# Patient Record
Sex: Male | Born: 1995 | Race: White | Hispanic: No | Marital: Single | Smoking: Current some day smoker
Health system: Southern US, Community
[De-identification: ages and names within clinical notes are randomized; demographics above are authoritative.]

## PROBLEM LIST (undated history)

## (undated) DIAGNOSIS — Z9289 Personal history of other medical treatment: Secondary | ICD-10-CM

## (undated) DIAGNOSIS — J45990 Exercise induced bronchospasm: Secondary | ICD-10-CM

## (undated) DIAGNOSIS — Y249XXA Unspecified firearm discharge, undetermined intent, initial encounter: Secondary | ICD-10-CM

## (undated) DIAGNOSIS — F329 Major depressive disorder, single episode, unspecified: Secondary | ICD-10-CM

## (undated) DIAGNOSIS — S069X9A Unspecified intracranial injury with loss of consciousness of unspecified duration, initial encounter: Secondary | ICD-10-CM

## (undated) DIAGNOSIS — F32A Depression, unspecified: Secondary | ICD-10-CM

## (undated) DIAGNOSIS — S060X9A Concussion with loss of consciousness of unspecified duration, initial encounter: Secondary | ICD-10-CM

## (undated) DIAGNOSIS — F419 Anxiety disorder, unspecified: Secondary | ICD-10-CM

## (undated) HISTORY — PX: TONSILLECTOMY AND ADENOIDECTOMY: SUR1326

## (undated) HISTORY — PX: TYMPANOSTOMY TUBE PLACEMENT: SHX32

---
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
## 2016-01-13 ENCOUNTER — Ambulatory Visit (INDEPENDENT_AMBULATORY_CARE_PROVIDER_SITE_OTHER): Payer: Managed Care, Other (non HMO) | Admitting: Urgent Care

## 2016-01-13 VITALS — BP 124/78 | HR 72 | Temp 97.7°F | Resp 12 | Ht 67.0 in | Wt 197.0 lb

## 2016-01-13 DIAGNOSIS — F17200 Nicotine dependence, unspecified, uncomplicated: Secondary | ICD-10-CM

## 2016-01-13 DIAGNOSIS — Z91048 Other nonmedicinal substance allergy status: Secondary | ICD-10-CM

## 2016-01-13 DIAGNOSIS — J4530 Mild persistent asthma, uncomplicated: Secondary | ICD-10-CM

## 2016-01-13 DIAGNOSIS — Z9109 Other allergy status, other than to drugs and biological substances: Secondary | ICD-10-CM

## 2016-01-13 DIAGNOSIS — H65192 Other acute nonsuppurative otitis media, left ear: Secondary | ICD-10-CM

## 2016-01-13 MED ORDER — MONTELUKAST SODIUM 10 MG PO TABS: 10.0000 mg | ORAL_TABLET | Freq: Every day | ORAL | Status: AC

## 2016-01-13 MED ORDER — AMOXICILLIN-POT CLAVULANATE 875-125 MG PO TABS: 1.0000 | ORAL_TABLET | Freq: Two times a day (BID) | ORAL | Status: DC

## 2016-01-13 MED ORDER — CETIRIZINE HCL 10 MG PO TABS: 10.0000 mg | ORAL_TABLET | Freq: Every day | ORAL | Status: DC

## 2016-01-13 MED ORDER — ALBUTEROL SULFATE HFA 108 (90 BASE) MCG/ACT IN AERS: 2.0000 | INHALATION_SPRAY | Freq: Four times a day (QID) | RESPIRATORY_TRACT | Status: AC | PRN

## 2016-01-13 NOTE — Progress Notes (Signed)
    MRN: [id number] DOB: 13-Dec-1995  Subjective:   Nathan Carter is a 20 y.o. male presenting for chief complaint of Otalgia  Reports 5 day history of left ear pain, fullness, decreased hearing, dizziness, slight tinnitus. Also has sinus congestion L>R, productive cough, shob, wheezing. Has not tried any medications for this. Denies fever, chest pain. Admits history of sports-induced asthma, needs another inhaler. Also has history eczema, allergies, does not take medications consistently for this. As a child patient had frequent ear infections, tympanostomy tubes requiring surgery for removal at 20 y/o. Smokes 1/2ppd for the past 4 years. Smokes marijuana multiple times a week. Has alcohol drink occasionally. Works in [employer], constantly feels that he is breathing in dust and dirty air. Has not thought about changing line of work.  Nathan Carter has a current medication list which includes the following prescription(s): albuterol, melatonin, methylphenidate, multivitamin, and zolpidem. Also has No Known Allergies.  Nathan Carter  has a past medical history of Depression and Asthma. Also  has past surgical history that includes Tympanostomy tube placement; Craniotomy (03/21/2012); and Craniotomy (08/20/2012).   Family history in parents and siblings is negative for heart disease, lung disease.  Objective:   Vitals: BP 124/78 mmHg  Pulse 72  Temp(Src) 97.7 F (36.5 C) (Oral)  Resp 12  Ht  (1.702 m)  Wt 197 lb (89.359 kg)  BMI 30.85 kg/m2  SpO2 97%  Physical Exam  Constitutional: He is oriented to person, place, and time. He appears well-developed and well-nourished.  HENT:  TM's flat bilaterally with signs of sclerosis, no effusions or erythema, tenderness over tragus, L>R. Nasal turbinates slightly erythematous with whitish mucus. No sinus tenderness. Postnasal drip present, without oropharyngeal exudates, erythema or abscesses.   Eyes: Right eye exhibits no discharge. Left eye  exhibits no discharge. No scleral icterus.  Neck: Normal range of motion. Neck supple.  Cardiovascular: Normal rate, regular rhythm and intact distal pulses.  Exam reveals no gallop and no friction rub.   No murmur heard. Pulmonary/Chest: No respiratory distress. He has no wheezes. He has no rales.  Lymphadenopathy:    He has no cervical adenopathy.  Neurological: He is alert and oriented to person, place, and time.  Skin: Skin is warm and dry.    Assessment and Plan :   1. Acute nonsuppurative otitis media of left ear - Start Augmentin to cover for infectious process, rtc in 1 week if no improvement.  2. Extrinsic asthma, mild persistent, uncomplicated - Start Singulair. Recommended patient change work since he is constantly inhaling respiratory irritants in [employer].  3. Environmental allergies - Start management with Zyrtec and Singulair.  4. Tobacco use disorder - Advised smoking cessation, patient will let me know if he needs medical therapy.  Wallis Bamberg, PA-C [facility] [phone] 01/13/2016 11:57 AM

## 2016-01-13 NOTE — Patient Instructions (Signed)
Otitis Media, Adult Otitis media is redness, soreness, and inflammation of the middle ear. Otitis media may be caused by allergies or, most commonly, by infection. Often it occurs as a complication of the common cold. SIGNS AND SYMPTOMS Symptoms of otitis media may include:  Earache.  Fever.  Ringing in your ear.  Headache.  Leakage of fluid from the ear. DIAGNOSIS To diagnose otitis media, your health care provider will examine your ear with an otoscope. This is an instrument that allows your health care provider to see into your ear in order to examine your eardrum. Your health care provider also will ask you questions about your symptoms. TREATMENT  Typically, otitis media resolves on its own within 3-5 days. Your health care provider may prescribe medicine to ease your symptoms of pain. If otitis media does not resolve within 5 days or is recurrent, your health care provider may prescribe antibiotic medicines if he or she suspects that a bacterial infection is the cause. HOME CARE INSTRUCTIONS   If you were prescribed an antibiotic medicine, finish it all even if you start to feel better.  Take medicines only as directed by your health care provider.  Keep all follow-up visits as directed by your health care provider. SEEK MEDICAL CARE IF:  You have otitis media only in one ear, or bleeding from your nose, or both.  You notice a lump on your neck.  You are not getting better in 3-5 days.  You feel worse instead of better. SEEK IMMEDIATE MEDICAL CARE IF:   You have pain that is not controlled with medicine.  You have swelling, redness, or pain around your ear or stiffness in your neck.  You notice that part of your face is paralyzed.  You notice that the bone behind your ear (mastoid) is tender when you touch it. MAKE SURE YOU:   Understand these instructions.  Will watch your condition.  Will get help right away if you are not doing well or get worse.   This  information is not intended to replace advice given to you by your health care provider. Make sure you discuss any questions you have with your health care provider.   Document Released: 08/08/2004 Document Revised: 11/24/2014 Document Reviewed: 05/31/2013 Elsevier Interactive Patient Education [employer]    Allergies An allergy is an abnormal reaction to a substance by the body's defense system (immune system). Allergies can develop at any age. WHAT CAUSES ALLERGIES? An allergic reaction happens when the immune system mistakenly reacts to a normally harmless substance, called an allergen, as if it were harmful. The immune system releases antibodies to fight the substance. Antibodies eventually release a chemical called histamine into the bloodstream. The release of histamine is meant to protect the body from infection, but it also causes discomfort. An allergic reaction can be triggered by:  Eating an allergen.  Inhaling an allergen.  Touching an allergen. WHAT TYPES OF ALLERGIES ARE THERE? There are many types of allergies. Common types include:  Seasonal allergies. People with this type of allergy are usually allergic to substances that are only present during certain seasons, such as molds and pollens.  Food allergies.  Drug allergies.  Insect allergies.  Animal dander allergies. WHAT ARE SYMPTOMS OF ALLERGIES? Possible allergy symptoms include:  Swelling of the lips, face, tongue, mouth, or throat.  Sneezing, coughing, or wheezing.  Nasal congestion.  Tingling in the mouth.  Rash.  Itching.  Itchy, red, swollen areas of skin (hives).  Watery  eyes.  Vomiting.  Diarrhea.  Dizziness.  Lightheadedness.  Fainting.  Trouble breathing or swallowing.  Chest tightness.  Rapid heartbeat. HOW ARE ALLERGIES DIAGNOSED? Allergies are diagnosed with a medical and family history and one or more of the following:  Skin tests.  Blood tests.  A food  diary. A food diary is a record of all the foods and drinks you have in a day and of all the symptoms you experience.  The results of an elimination diet. An elimination diet involves eliminating foods from your diet and then adding them back in one by one to find out if a certain food causes an allergic reaction. HOW ARE ALLERGIES TREATED? There is no cure for allergies, but allergic reactions can be treated with medicine. Severe reactions usually need to be treated at a hospital. HOW CAN REACTIONS BE PREVENTED? The best way to prevent an allergic reaction is by avoiding the substance you are allergic to. Allergy shots and medicines can also help prevent reactions in some cases. People with severe allergic reactions may be able to prevent a life-threatening reaction called anaphylaxis with a medicine given right after exposure to the allergen.   This information is not intended to replace advice given to you by your health care provider. Make sure you discuss any questions you have with your health care provider.   Document Released: 01/27/2003 Document Revised: 11/24/2014 Document Reviewed: 08/15/2014 Elsevier Interactive Patient Education 2016 [employer].     Smoking Cessation, Tips for Success If you are ready to quit smoking, congratulations! You have chosen to help yourself be healthier. Cigarettes bring nicotine, tar, carbon monoxide, and other irritants into your body. Your lungs, heart, and blood vessels will be able to work better without these poisons. There are many different ways to quit smoking. Nicotine gum, nicotine patches, a nicotine inhaler, or nicotine nasal spray can help with physical craving. Hypnosis, support groups, and medicines help break the habit of smoking. WHAT THINGS CAN I DO TO MAKE QUITTING EASIER?  Here are some tips to help you quit for good:  Pick a date when you will quit smoking completely. Tell all of your friends and family about your plan to quit on  that date.  Do not try to slowly cut down on the number of cigarettes you are smoking. Pick a quit date and quit smoking completely starting on that day.  Throw away all cigarettes.   Clean and remove all ashtrays from your home, work, and car.  On a card, write down your reasons for quitting. Carry the card with you and read it when you get the urge to smoke.  Cleanse your body of nicotine. Drink enough water and fluids to keep your urine clear or pale yellow. Do this after quitting to flush the nicotine from your body.  Learn to predict your moods. Do not let a bad situation be your excuse to have a cigarette. Some situations in your life might tempt you into wanting a cigarette.  Never have "just one" cigarette. It leads to wanting another and another. Remind yourself of your decision to quit.  Change habits associated with smoking. If you smoked while driving or when feeling stressed, try other activities to replace smoking. Stand up when drinking your coffee. Brush your teeth after eating. Sit in a different chair when you read the paper. Avoid alcohol while trying to quit, and try to drink fewer caffeinated beverages. Alcohol and caffeine may urge you to smoke.  Avoid foods  and drinks that can trigger a desire to smoke, such as sugary or spicy foods and alcohol.  Ask people who smoke not to smoke around you.  Have something planned to do right after eating or having a cup of coffee. For example, plan to take a walk or exercise.  Try a relaxation exercise to calm you down and decrease your stress. Remember, you may be tense and nervous for the first 2 weeks after you quit, but this will pass.  Find new activities to keep your hands busy. Play with a pen, coin, or rubber band. Doodle or draw things on paper.  Brush your teeth right after eating. This will help cut down on the craving for the taste of tobacco after meals. You can also try mouthwash.   Use oral substitutes in place  of cigarettes. Try using lemon drops, carrots, cinnamon sticks, or chewing gum. Keep them handy so they are available when you have the urge to smoke.  When you have the urge to smoke, try deep breathing.  Designate your home as a nonsmoking area.  If you are a heavy smoker, ask your health care provider about a prescription for nicotine chewing gum. It can ease your withdrawal from nicotine.  Reward yourself. Set aside the cigarette money you save and buy yourself something nice.  Look for support from others. Join a support group or smoking cessation program. Ask someone at home or at work to help you with your plan to quit smoking.  Always ask yourself, "Do I need this cigarette or is this just a reflex?" Tell yourself, "Today, I choose not to smoke," or "I do not want to smoke." You are reminding yourself of your decision to quit.  Do not replace cigarette smoking with electronic cigarettes (commonly called e-cigarettes). The safety of e-cigarettes is unknown, and some may contain harmful chemicals.  If you relapse, do not give up! Plan ahead and think about what you will do the next time you get the urge to smoke. HOW WILL I FEEL WHEN I QUIT SMOKING? You may have symptoms of withdrawal because your body is used to nicotine (the addictive substance in cigarettes). You may crave cigarettes, be irritable, feel very hungry, cough often, get headaches, or have difficulty concentrating. The withdrawal symptoms are only temporary. They are strongest when you first quit but will go away within 10-14 days. When withdrawal symptoms occur, stay in control. Think about your reasons for quitting. Remind yourself that these are signs that your body is healing and getting used to being without cigarettes. Remember that withdrawal symptoms are easier to treat than the major diseases that smoking can cause.  Even after the withdrawal is over, expect periodic urges to smoke. However, these cravings are  generally short lived and will go away whether you smoke or not. Do not smoke! WHAT RESOURCES ARE AVAILABLE TO HELP ME QUIT SMOKING? Your health care provider can direct you to community resources or hospitals for support, which may include:  Group support.  Education.  Hypnosis.  Therapy.   This information is not intended to replace advice given to you by your health care provider. Make sure you discuss any questions you have with your health care provider.   Document Released: 08/01/2004 Document Revised: 11/24/2014 Document Reviewed: 04/21/2013 Elsevier Interactive Patient Education [employer]

## 2016-05-22 ENCOUNTER — Encounter (HOSPITAL_COMMUNITY): Payer: Self-pay | Admitting: Emergency Medicine

## 2016-05-22 ENCOUNTER — Emergency Department (HOSPITAL_COMMUNITY): Payer: No Typology Code available for payment source

## 2016-05-22 ENCOUNTER — Inpatient Hospital Stay (HOSPITAL_COMMUNITY)
Admission: EM | Admit: 2016-05-22 | Discharge: 2016-05-25 | DRG: 964 | Disposition: A | Discharge status: Home Health | Payer: No Typology Code available for payment source | Attending: General Surgery | Admitting: General Surgery

## 2016-05-22 DIAGNOSIS — M25552 Pain in left hip: Secondary | ICD-10-CM | POA: Diagnosis present

## 2016-05-22 DIAGNOSIS — S32592A Other specified fracture of left pubis, initial encounter for closed fracture: Secondary | ICD-10-CM | POA: Diagnosis present

## 2016-05-22 DIAGNOSIS — R58 Hemorrhage, not elsewhere classified: Secondary | ICD-10-CM | POA: Diagnosis present

## 2016-05-22 DIAGNOSIS — S36899A Unspecified injury of other intra-abdominal organs, initial encounter: Secondary | ICD-10-CM | POA: Diagnosis present

## 2016-05-22 DIAGNOSIS — F329 Major depressive disorder, single episode, unspecified: Secondary | ICD-10-CM | POA: Diagnosis present

## 2016-05-22 DIAGNOSIS — S3210XA Unspecified fracture of sacrum, initial encounter for closed fracture: Secondary | ICD-10-CM | POA: Diagnosis present

## 2016-05-22 DIAGNOSIS — Z79899 Other long term (current) drug therapy: Secondary | ICD-10-CM

## 2016-05-22 DIAGNOSIS — F419 Anxiety disorder, unspecified: Secondary | ICD-10-CM | POA: Diagnosis present

## 2016-05-22 DIAGNOSIS — M542 Cervicalgia: Secondary | ICD-10-CM

## 2016-05-22 DIAGNOSIS — Z87820 Personal history of traumatic brain injury: Secondary | ICD-10-CM | POA: Diagnosis not present

## 2016-05-22 DIAGNOSIS — S161XXA Strain of muscle, fascia and tendon at neck level, initial encounter: Secondary | ICD-10-CM | POA: Diagnosis present

## 2016-05-22 DIAGNOSIS — J45909 Unspecified asthma, uncomplicated: Secondary | ICD-10-CM | POA: Diagnosis present

## 2016-05-22 DIAGNOSIS — F17210 Nicotine dependence, cigarettes, uncomplicated: Secondary | ICD-10-CM | POA: Diagnosis present

## 2016-05-22 DIAGNOSIS — S060X9A Concussion with loss of consciousness of unspecified duration, initial encounter: Secondary | ICD-10-CM | POA: Diagnosis present

## 2016-05-22 DIAGNOSIS — S32512A Fracture of superior rim of left pubis, initial encounter for closed fracture: Secondary | ICD-10-CM | POA: Diagnosis present

## 2016-05-22 DIAGNOSIS — S060XAA Concussion with loss of consciousness status unknown, initial encounter: Secondary | ICD-10-CM

## 2016-05-22 DIAGNOSIS — S329XXA Fracture of unspecified parts of lumbosacral spine and pelvis, initial encounter for closed fracture: Secondary | ICD-10-CM

## 2016-05-22 HISTORY — DX: Unspecified firearm discharge, undetermined intent, initial encounter: Y24.9XXA

## 2016-05-22 HISTORY — DX: Concussion with loss of consciousness of unspecified duration, initial encounter: S06.0X9A

## 2016-05-22 HISTORY — DX: Unspecified intracranial injury with loss of consciousness of unspecified duration, initial encounter: S06.9X9A

## 2016-05-22 HISTORY — DX: Personal history of other medical treatment: Z92.89

## 2016-05-22 HISTORY — DX: Exercise induced bronchospasm: J45.990

## 2016-05-22 HISTORY — DX: Concussion with loss of consciousness status unknown, initial encounter: S06.0XAA

## 2016-05-22 HISTORY — DX: Anxiety disorder, unspecified: F41.9

## 2016-05-22 LAB — COMPREHENSIVE METABOLIC PANEL
ALK PHOS: 58 U/L (ref 38–126)
ALT: 82 U/L — AB (ref 17–63)
ANION GAP: 13 (ref 5–15)
AST: 58 U/L — ABNORMAL HIGH (ref 15–41)
Albumin: 4.2 g/dL (ref 3.5–5.0)
BILIRUBIN TOTAL: 1.7 mg/dL — AB (ref 0.3–1.2)
BUN: 9 mg/dL (ref 6–20)
CALCIUM: 9.5 mg/dL (ref 8.9–10.3)
CO2: 21 mmol/L — AB (ref 22–32)
CREATININE: 1.09 mg/dL (ref 0.61–1.24)
Chloride: 105 mmol/L (ref 101–111)
GFR calc non Af Amer: >60 mL/min (ref >60)
GLUCOSE: 115 mg/dL — AB (ref 65–99)
Potassium: 3.2 mmol/L — ABNORMAL LOW (ref 3.5–5.1)
SODIUM: 139 mmol/L (ref 135–145)
TOTAL PROTEIN: 7 g/dL (ref 6.5–8.1)

## 2016-05-22 LAB — I-STAT CHEM 8, ED
BUN: 10 mg/dL (ref 6–20)
CALCIUM ION: 1.03 mmol/L — AB (ref 1.13–1.30)
CHLORIDE: 104 mmol/L (ref 101–111)
Creatinine, Ser: 1 mg/dL (ref 0.61–1.24)
GLUCOSE: 111 mg/dL — AB (ref 65–99)
HCT: 48 % (ref 39.0–52.0)
Hemoglobin: 16.3 g/dL (ref 13.0–17.0)
POTASSIUM: 3.2 mmol/L — AB (ref 3.5–5.1)
Sodium: 142 mmol/L (ref 135–145)
TCO2: 20 mmol/L (ref 0–100)

## 2016-05-22 LAB — CBC
HCT: 45.6 % (ref 39.0–52.0)
Hemoglobin: 16.4 g/dL (ref 13.0–17.0)
MCH: 30.6 pg (ref 26.0–34.0)
MCHC: 36 g/dL (ref 30.0–36.0)
MCV: 85.1 fL (ref 78.0–100.0)
PLATELETS: 229 10*3/uL (ref 150–400)
RBC: 5.36 MIL/uL (ref 4.22–5.81)
RDW: 12.1 % (ref 11.5–15.5)
WBC: 18.5 10*3/uL — ABNORMAL HIGH (ref 4.0–10.5)

## 2016-05-22 LAB — I-STAT CG4 LACTIC ACID, ED: LACTIC ACID, VENOUS: 3.79 mmol/L — AB (ref 0.5–1.9)

## 2016-05-22 LAB — ETHANOL

## 2016-05-22 LAB — PROTIME-INR
INR: 1.08 (ref 0.00–1.49)
Prothrombin Time: 14.2 seconds (ref 11.6–15.2)

## 2016-05-22 LAB — SAMPLE TO BLOOD BANK

## 2016-05-22 LAB — CDS SEROLOGY

## 2016-05-22 MED ORDER — ONDANSETRON HCL 4 MG/2ML IJ SOLN
4.0000 mg | Freq: Once | INTRAMUSCULAR | Status: AC
  Administered 2016-05-22: 4 mg via INTRAVENOUS
  Filled 2016-05-22: qty 2

## 2016-05-22 MED ORDER — ONDANSETRON HCL 4 MG/2ML IJ SOLN
4.0000 mg | Freq: Four times a day (QID) | INTRAMUSCULAR | Status: DC | PRN
  Administered 2016-05-23: 4 mg via INTRAVENOUS
  Filled 2016-05-22 (×2): qty 2

## 2016-05-22 MED ORDER — ZOLPIDEM TARTRATE 5 MG PO TABS: 5.0000 mg | ORAL_TABLET | Freq: Every evening | ORAL | Status: DC | PRN

## 2016-05-22 MED ORDER — FENTANYL CITRATE (PF) 100 MCG/2ML IJ SOLN
50.0000 ug | Freq: Once | INTRAMUSCULAR | Status: AC
  Administered 2016-05-22: 50 ug via INTRAVENOUS

## 2016-05-22 MED ORDER — FENTANYL CITRATE (PF) 100 MCG/2ML IJ SOLN
100.0000 ug | Freq: Once | INTRAMUSCULAR | Status: AC
  Administered 2016-05-22: 100 ug via INTRAVENOUS
  Filled 2016-05-22: qty 2

## 2016-05-22 MED ORDER — LORATADINE 10 MG PO TABS
10.0000 mg | ORAL_TABLET | Freq: Every day | ORAL | Status: DC
  Administered 2016-05-23 – 2016-05-25 (×3): 10 mg via ORAL
  Filled 2016-05-22 (×3): qty 1

## 2016-05-22 MED ORDER — ONDANSETRON HCL 4 MG PO TABS: 4.0000 mg | ORAL_TABLET | Freq: Four times a day (QID) | ORAL | Status: DC | PRN

## 2016-05-22 MED ORDER — SODIUM CHLORIDE 0.9 % IV BOLUS (SEPSIS)
1000.0000 mL | Freq: Once | INTRAVENOUS | Status: AC
Start: 2016-05-22 — End: 2016-05-22
  Administered 2016-05-22: 1000 mL via INTRAVENOUS

## 2016-05-22 MED ORDER — ENOXAPARIN SODIUM 40 MG/0.4ML ~~LOC~~ SOLN: 40.0000 mg | SUBCUTANEOUS | Status: DC

## 2016-05-22 MED ORDER — ACETAMINOPHEN 325 MG PO TABS: 650.0000 mg | ORAL_TABLET | ORAL | Status: DC | PRN

## 2016-05-22 MED ORDER — FENTANYL CITRATE (PF) 100 MCG/2ML IJ SOLN
INTRAMUSCULAR | Status: AC
  Administered 2016-05-22: 50 ug via INTRAVENOUS
  Filled 2016-05-22: qty 2

## 2016-05-22 MED ORDER — IOPAMIDOL (ISOVUE-300) INJECTION 61%
INTRAVENOUS | Status: AC
  Administered 2016-05-22: 100 mL
  Filled 2016-05-22: qty 100

## 2016-05-22 MED ORDER — OXYCODONE HCL 5 MG PO TABS: 5.0000 mg | ORAL_TABLET | ORAL | Status: DC | PRN

## 2016-05-22 MED ORDER — KCL IN DEXTROSE-NACL 20-5-0.45 MEQ/L-%-% IV SOLN
INTRAVENOUS | Status: DC
Start: 2016-05-23 — End: 2016-05-23
  Administered 2016-05-23: via INTRAVENOUS
  Filled 2016-05-22: qty 1000

## 2016-05-22 MED ORDER — MONTELUKAST SODIUM 10 MG PO TABS
10.0000 mg | ORAL_TABLET | Freq: Every day | ORAL | Status: DC
  Administered 2016-05-23 – 2016-05-24 (×2): 10 mg via ORAL
  Filled 2016-05-22 (×3): qty 1

## 2016-05-22 MED ORDER — ALBUTEROL SULFATE (2.5 MG/3ML) 0.083% IN NEBU: 3.0000 mL | INHALATION_SOLUTION | Freq: Four times a day (QID) | RESPIRATORY_TRACT | Status: DC | PRN

## 2016-05-22 MED ORDER — MORPHINE SULFATE (PF) 2 MG/ML IV SOLN
2.0000 mg | INTRAVENOUS | Status: DC | PRN
  Administered 2016-05-23 (×2): 4 mg via INTRAVENOUS
  Filled 2016-05-22 (×2): qty 2

## 2016-05-22 MED ORDER — OXYCODONE HCL 5 MG PO TABS: 10.0000 mg | ORAL_TABLET | ORAL | Status: DC | PRN

## 2016-05-22 NOTE — ED Notes (Signed)
Family made aware of pts bed assignment 
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
## 2016-05-22 NOTE — ED Notes (Signed)
Portable at bedside 
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
## 2016-05-22 NOTE — ED Notes (Signed)
Pt still at CT.

## 2016-05-23 DIAGNOSIS — S060X9A Concussion with loss of consciousness of unspecified duration, initial encounter: Secondary | ICD-10-CM | POA: Diagnosis present

## 2016-05-23 DIAGNOSIS — S32512A Fracture of superior rim of left pubis, initial encounter for closed fracture: Secondary | ICD-10-CM | POA: Diagnosis present

## 2016-05-23 DIAGNOSIS — S32592A Other specified fracture of left pubis, initial encounter for closed fracture: Secondary | ICD-10-CM | POA: Diagnosis present

## 2016-05-23 DIAGNOSIS — R58 Hemorrhage, not elsewhere classified: Secondary | ICD-10-CM | POA: Diagnosis present

## 2016-05-23 LAB — URINALYSIS, ROUTINE W REFLEX MICROSCOPIC
Bilirubin Urine: NEGATIVE
GLUCOSE, UA: NEGATIVE mg/dL
Ketones, ur: 15 mg/dL — AB
LEUKOCYTES UA: NEGATIVE
Nitrite: NEGATIVE
PH: 6 (ref 5.0–8.0)
Protein, ur: NEGATIVE mg/dL
SPECIFIC GRAVITY, URINE: 1.041 — AB (ref 1.005–1.030)

## 2016-05-23 LAB — URINE MICROSCOPIC-ADD ON: WBC UA: NONE SEEN WBC/hpf (ref 0–5)

## 2016-05-23 LAB — CBC
HEMATOCRIT: 40.8 % (ref 39.0–52.0)
HEMATOCRIT: 41.6 % (ref 39.0–52.0)
HEMOGLOBIN: 14.2 g/dL (ref 13.0–17.0)
HEMOGLOBIN: 14.3 g/dL (ref 13.0–17.0)
MCH: 30.1 pg (ref 26.0–34.0)
MCH: 30.2 pg (ref 26.0–34.0)
MCHC: 34.8 g/dL (ref 30.0–36.0)
MCHC: 34.4 g/dL (ref 30.0–36.0)
MCV: 86.6 fL (ref 78.0–100.0)
MCV: 87.9 fL (ref 78.0–100.0)
PLATELETS: 170 10*3/uL (ref 150–400)
Platelets: 182 10*3/uL (ref 150–400)
RBC: 4.71 MIL/uL (ref 4.22–5.81)
RBC: 4.73 MIL/uL (ref 4.22–5.81)
RDW: 12.3 % (ref 11.5–15.5)
RDW: 12.4 % (ref 11.5–15.5)
WBC: 10.5 10*3/uL (ref 4.0–10.5)
WBC: 9.3 10*3/uL (ref 4.0–10.5)

## 2016-05-23 MED ORDER — MORPHINE SULFATE (PF) 2 MG/ML IV SOLN: 2.0000 mg | INTRAVENOUS | Status: DC | PRN

## 2016-05-23 MED ORDER — POLYETHYLENE GLYCOL 3350 17 G PO PACK
17.0000 g | PACK | Freq: Every day | ORAL | Status: DC
  Administered 2016-05-23 – 2016-05-24 (×2): 17 g via ORAL
  Filled 2016-05-23 (×3): qty 1

## 2016-05-23 MED ORDER — DOCUSATE SODIUM 100 MG PO CAPS
100.0000 mg | ORAL_CAPSULE | Freq: Two times a day (BID) | ORAL | Status: DC
  Administered 2016-05-23 – 2016-05-25 (×5): 100 mg via ORAL
  Filled 2016-05-23 (×5): qty 1

## 2016-05-23 MED ORDER — OXYCODONE HCL 5 MG PO TABS
5.0000 mg | ORAL_TABLET | ORAL | Status: DC | PRN
Start: 2016-05-23 — End: 2016-05-25
  Administered 2016-05-23: 15 mg via ORAL
  Administered 2016-05-24: 10 mg via ORAL
  Filled 2016-05-23: qty 2
  Filled 2016-05-23: qty 3
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
## 2016-05-23 NOTE — Clinical Social Work Note (Signed)
CSW received referral for SNF.  Case discussed with case manager and plan is to discharge home.  CSW to sign off please re-consult if social work needs arise.  Jakari Jacot R. Ayliana Casciano, MSW, LCSWA [phone]  
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
## 2016-05-23 NOTE — Care Management Note (Addendum)
Case Management Note  Patient Details  Name: Nathan Carter MRN: [id number] Date of Birth: [birth year]  Subjective/Objective:                    Action/Plan:  Discussed discharge planning with patient and his mother Nathan Carter at bedside . Confirmed face sheet information with patient's mother and that patient has [employer] insurance. Explained to arrange home health for [employer] members CM calls and faxses information to [facility] who arranges home health services and then calls member directly with name of home health agency . Patient's mother voices information.  Patient has walker at home.   [facility] fax number [phone]  Intake number [id number] Expected Discharge Date:                  Expected Discharge Plan:  Home w Home Health Services  In-House Referral:     Discharge planning Services  CM Consult  Post Acute Care Choice:  Home Health Choice offered to:  Patient, Parent  DME Arranged:    DME Agency:     HH Arranged:  PT HH Agency:     Status of Service:  Completed, signed off  If discussed at Long Length of Stay Meetings, dates discussed:    Additional Comments:  Kingsley PlanWile, Maury Groninger Marie, RN 05/23/2016, 2:41 PM

## 2016-05-24 MED ORDER — IBUPROFEN 600 MG PO TABS: 600.0000 mg | ORAL_TABLET | Freq: Four times a day (QID) | ORAL | Status: DC | PRN
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
## 2016-05-25 ENCOUNTER — Inpatient Hospital Stay (HOSPITAL_COMMUNITY): Payer: No Typology Code available for payment source

## 2016-05-25 ENCOUNTER — Encounter: Payer: Self-pay | Admitting: General Surgery

## 2016-05-25 MED ORDER — OXYCODONE HCL 5 MG PO TABS: 5.0000 mg | ORAL_TABLET | ORAL | Status: AC | PRN

## 2016-05-25 MED ORDER — IBUPROFEN 200 MG PO TABS: ORAL_TABLET | ORAL | Status: AC

## 2016-05-25 MED ORDER — POLYETHYLENE GLYCOL 3350 17 G PO PACK: PACK | ORAL | Status: AC

## 2016-05-25 MED ORDER — ACETAMINOPHEN 325 MG PO TABS: 650.0000 mg | ORAL_TABLET | ORAL | Status: AC | PRN

## 2016-05-25 NOTE — Progress Notes (Signed)
Patient ID: Nathan CaterCollin Carter, male   DOB: 07-Apr-1996, 20 y.o.   MRN: [id number] Pelvic xray no change. Plan as outlined by Dr. Lajoyce Cornersuda.
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
## 2016-05-25 NOTE — Discharge Instructions (Signed)
Simple Pelvic Fracture, Adult °A pelvic fracture is a break in one of the pelvic bones. The pelvic bones include the bones that you sit on and the bones that make up the lower part of your spine. A pelvic fracture is called simple if the broken bones are stable and are not moving out of place. °CAUSES  °Common causes of this type of fracture include: °· A fall. °· A car accident. °· Force or pressure applied to the pelvis. °RISK FACTORS °You may be at higher risk for this type of fracture if: °· You play high-impact sports. °· You are an older person with a condition that causes weak bones (osteoporosis). °· You have a bone-weakening disease. °SIGNS AND SYMPTOMS °Signs and symptoms may include: °· Tenderness, swelling, or bruising in the affected area. °· Pain when moving the hip. °· Pain when walking or standing. °DIAGNOSIS °A diagnosis is made with a physical exam and X-rays. Sometimes, a CT scan is also done. °TREATMENT °The goal of treatment is to get the bones to heal in a good position. Treatment of a simple pelvic fracture usually involves staying in bed (bed rest) and using crutches or a walker until the bones heal. Medicines may be prescribed for pain. Medicines may also be prescribed that help to prevent blood clots from forming in the legs. °HOME CARE INSTRUCTIONS °Managing Pain, Stiffness, and Swelling °· If directed, apply ice to the injured area: °¨ Put ice in a plastic bag. °¨ Place a towel between your skin and the bag. °¨ Leave the ice on for 20 minutes, 2-3 times a day. °· Raise the injured area above the level of your heart while you are sitting or lying down. °Driving °· Do not  drive or operate heavy machinery until your health care provider tells you it is safe to do. °Activity °· Stay on bed rest for as long as directed by your health care provider. °· While on bed rest: °¨ Change the position of your legs every 1-2 hours. This keeps blood moving well through both of your legs. °¨ You may sit  for as long as you feel comfortable. °· After bed rest: °¨ Avoid strenuous activities for as long as directed by your health care provider. °¨ Return to your normal activities as directed by your health care provider. Ask your health care provider what activities are safe for you. °Safety °· Do not use the injured limb to support your body weight until your health care provider says that you can. Use crutches or a walker as directed by your health care provider. °General Instructions °· Do not use any tobacco products, including cigarettes, chewing tobacco, or electronic cigarettes. Tobacco can delay bone healing. If you need help quitting, ask your health care provider. °· Take medicines only as directed by your health care provider. °· Keep all follow-up visits as directed by your health care provider. This is important. °SEEK MEDICAL CARE IF: °· Your pain gets worse. °· Your pain is not relieved with medicines. °SEEK IMMEDIATE MEDICAL CARE IF: °· You feel light-headed or faint. °· You develop chest pain. °· You develop shortness of breath. °· You have a fever. °· You have blood in your urine or your stools. °· You have vaginal bleeding. °· You have difficulty or pain with urination or with a bowel movement. °· You have difficulty or increased pain with walking. °· You have new or increased swelling in one of your legs. °· You have numbness in your   legs or groin area. °  °This information is not intended to replace advice given to you by your health care provider. Make sure you discuss any questions you have with your health care provider. °  °Document Released: 01/12/2002 Document Revised: 11/24/2014 Document Reviewed: 06/27/2014 °Elsevier Interactive Patient Education ©2016 Elsevier Inc. ° °

## 2016-08-25 ENCOUNTER — Ambulatory Visit (INDEPENDENT_AMBULATORY_CARE_PROVIDER_SITE_OTHER): Payer: Managed Care, Other (non HMO) | Admitting: Family Medicine

## 2016-08-25 VITALS — BP 112/64 | HR 75 | Temp 98.5°F | Resp 16 | Ht 66.0 in | Wt 199.0 lb

## 2016-08-25 DIAGNOSIS — Z23 Encounter for immunization: Secondary | ICD-10-CM | POA: Diagnosis not present

## 2016-08-25 DIAGNOSIS — J209 Acute bronchitis, unspecified: Secondary | ICD-10-CM

## 2016-08-25 DIAGNOSIS — R062 Wheezing: Secondary | ICD-10-CM

## 2016-08-25 MED ORDER — IPRATROPIUM BROMIDE 0.02 % IN SOLN
0.5000 mg | Freq: Once | RESPIRATORY_TRACT | Status: AC
  Administered 2016-08-25: 0.5 mg via RESPIRATORY_TRACT

## 2016-08-25 MED ORDER — ALBUTEROL SULFATE (2.5 MG/3ML) 0.083% IN NEBU
2.5000 mg | INHALATION_SOLUTION | Freq: Once | RESPIRATORY_TRACT | Status: AC
  Administered 2016-08-25: 2.5 mg via RESPIRATORY_TRACT

## 2016-08-25 MED ORDER — AMOXICILLIN 875 MG PO TABS: 875.0000 mg | ORAL_TABLET | Freq: Two times a day (BID) | ORAL | 0 refills | Status: DC

## 2016-08-25 MED ORDER — BENZONATATE 100 MG PO CAPS
100.0000 mg | ORAL_CAPSULE | Freq: Three times a day (TID) | ORAL | 0 refills | Status: DC | PRN
Start: 2016-08-25 — End: 2021-01-23

## 2016-08-25 NOTE — Patient Instructions (Addendum)
Start Amoxicillin 875 twice daily for 10 days.  Use your already prescribed albuterol inhaler 2 puffs every 4-6 hours as needed for shortness of breath or wheezing.  May take ibuprofen for sore throat as needed.  Return for care if you develop a fever or if symptoms do not improve.  Godfrey PickKimberly S. Tiburcio PeaHarris, MSN, FNP-C [facility]   IF you received an x-ray today, you will receive an invoice from [facility]. Please contact [facility] at [phone] with questions or concerns regarding your invoice.   IF you received labwork today, you will receive an invoice from [facility]. Please contact [facility] at [phone] with questions or concerns regarding your invoice.   Our billing staff will not be able to assist you with questions regarding bills from these companies.  You will be contacted with the lab results as soon as they are available. The fastest way to get your results is to activate your My Chart account. Instructions are located on the last page of this paperwork. If you have not heard from us regarding the results in 2 weeks, please contact this office.     Acute Bronchitis Bronchitis is when the airways that extend from the windpipe into the lungs get red, puffy, and painful (inflamed). Bronchitis often causes thick spit (mucus) to develop. This leads to a cough. A cough is the most common symptom of bronchitis. In acute bronchitis, the condition usually begins suddenly and goes away over time (usually in 2 weeks). Smoking, allergies, and asthma can make bronchitis worse. Repeated episodes of bronchitis may cause more lung problems. HOME CARE  Rest.  Drink enough fluids to keep your pee (urine) clear or pale yellow (unless you need to limit fluids as told by your doctor).  Only take over-the-counter or prescription medicines as told by your doctor.  Avoid smoking and secondhand smoke.  These can make bronchitis worse. If you are a smoker, think about using nicotine gum or skin patches. Quitting smoking will help your lungs heal faster.  Reduce the chance of getting bronchitis again by:  Washing your hands often.  Avoiding people with cold symptoms.  Trying not to touch your hands to your mouth, nose, or eyes.  Follow up with your doctor as told. GET HELP IF: Your symptoms do not improve after 1 week of treatment. Symptoms include:  Cough.  Fever.  Coughing up thick spit.  Body aches.  Chest congestion.  Chills.  Shortness of breath.  Sore throat. GET HELP RIGHT AWAY IF:   You have an increased fever.  You have chills.  You have severe shortness of breath.  You have bloody thick spit (sputum).  You throw up (vomit) often.  You lose too much body fluid (dehydration).  You have a severe headache.  You faint. MAKE SURE YOU:   Understand these instructions.  Will watch your condition.  Will get help right away if you are not doing well or get worse.   This information is not intended to replace advice given to you by your health care provider. Make sure you discuss any questions you have with your health care provider.   Document Released: 04/21/2008 Document Revised: 07/06/2013 Document Reviewed: 04/26/2013 Elsevier Interactive Patient Education [employer]2016 Elsevier Inc.
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
## 2017-03-28 ENCOUNTER — Other Ambulatory Visit: Payer: Self-pay | Admitting: Urgent Care

## 2021-01-23 ENCOUNTER — Telehealth (INDEPENDENT_AMBULATORY_CARE_PROVIDER_SITE_OTHER): Payer: Managed Care, Other (non HMO) | Admitting: Family Medicine

## 2021-01-23 ENCOUNTER — Other Ambulatory Visit: Payer: Self-pay

## 2021-01-23 ENCOUNTER — Encounter: Payer: Self-pay | Admitting: Family Medicine

## 2021-01-23 VITALS — Ht 66.0 in | Wt 211.0 lb

## 2021-01-23 DIAGNOSIS — R059 Cough, unspecified: Secondary | ICD-10-CM

## 2021-01-23 DIAGNOSIS — J069 Acute upper respiratory infection, unspecified: Secondary | ICD-10-CM

## 2021-01-23 MED ORDER — BENZONATATE 100 MG PO CAPS: 100.0000 mg | ORAL_CAPSULE | Freq: Three times a day (TID) | ORAL | 0 refills | Status: AC | PRN
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
## 2021-01-23 NOTE — Patient Instructions (Addendum)
mucinex or mucinex DM for cough.  Drink plenty of fluids. Saline nasal spray as needed. Ok to continue allergy medications as well such as Zyrtec.  Tessalon Perles sent to your pharmacy if needed to suppress cough.  I do recommend COVID-19 test, and wearing a mask at all times around others until you receive the results of that test. I expect symptoms to be improving into next week.  Follow-up if symptoms are not improving or worsening.   Return to the clinic or go to the nearest emergency room if any of your symptoms worsen or new symptoms occur.  Upper Respiratory Infection, Adult An upper respiratory infection (URI) is a common viral infection of the nose, throat, and upper air passages that lead to the lungs. The most common type of URI is the common cold. URIs usually get better on their own, without medical treatment. What are the causes? A URI is caused by a virus. You may catch a virus by:  Breathing in droplets from an infected person's cough or sneeze.  Touching something that has been exposed to the virus (contaminated) and then touching your mouth, nose, or eyes. What increases the risk? You are more likely to get a URI if:  You are very young or very old.  It is autumn or winter.  You have close contact with others, such as at a daycare, school, or health care facility.  You smoke.  You have long-term (chronic) heart or lung disease.  You have a weakened disease-fighting (immune) system.  You have nasal allergies or asthma.  You are experiencing a lot of stress.  You work in an area that has poor air circulation.  You have poor nutrition. What are the signs or symptoms? A URI usually involves some of the following symptoms:  Runny or stuffy (congested) nose.  Sneezing.  Cough.  Sore throat.  Headache.  Fatigue.  Fever.  Loss of appetite.  Pain in your forehead, behind your eyes, and over your cheekbones (sinus pain).  Muscle aches.  Redness  or irritation of the eyes.  Pressure in the ears or face. How is this diagnosed? This condition may be diagnosed based on your medical history and symptoms, and a physical exam. Your health care provider may use a cotton swab to take a mucus sample from your nose (nasal swab). This sample can be tested to determine what virus is causing the illness. How is this treated? URIs usually get better on their own within 7-10 days. You can take steps at home to relieve your symptoms. Medicines cannot cure URIs, but your health care provider may recommend certain medicines to help relieve symptoms, such as:  Over-the-counter cold medicines.  Cough suppressants. Coughing is a type of defense against infection that helps to clear the respiratory system, so take these medicines only as recommended by your health care provider.  Fever-reducing medicines. Follow these instructions at home: Activity  Rest as needed.  If you have a fever, stay home from work or school until your fever is gone or until your health care provider says you are no longer contagious. Your health care provider may have you wear a face mask to prevent your infection from spreading. Relieving symptoms  Gargle with a salt-water mixture 3-4 times a day or as needed. To make a salt-water mixture, completely dissolve -1 tsp of salt in 1 cup of warm water.  Use a cool-mist humidifier to add moisture to the air. This can help you breathe more  easily. Eating and drinking  Drink enough fluid to keep your urine pale yellow.  Eat soups and other clear broths.   General instructions  Take over-the-counter and prescription medicines only as told by your health care provider. These include cold medicines, fever reducers, and cough suppressants.  Do not use any products that contain nicotine or tobacco, such as cigarettes and e-cigarettes. If you need help quitting, ask your health care provider.  Stay away from secondhand  smoke.  Stay up to date on all immunizations, including the yearly (annual) flu vaccine.  Keep all follow-up visits as told by your health care provider. This is important.   How to prevent the spread of infection to others  URIs can be passed from person to person (are contagious). To prevent the infection from spreading: ? Wash your hands often with soap and water. If soap and water are not available, use hand sanitizer. ? Avoid touching your mouth, face, eyes, or nose. ? Cough or sneeze into a tissue or your sleeve or elbow instead of into your hand or into the air.   Contact a health care provider if:  You are getting worse instead of better.  You have a fever or chills.  Your mucus is brown or red.  You have yellow or brown discharge coming from your nose.  You have pain in your face, especially when you bend forward.  You have swollen neck glands.  You have pain while swallowing.  You have white areas in the back of your throat. Get help right away if:  You have shortness of breath that gets worse.  You have severe or persistent: ? Headache. ? Ear pain. ? Sinus pain. ? Chest pain.  You have chronic lung disease along with any of the following: ? Wheezing. ? Prolonged cough. ? Coughing up blood. ? A change in your usual mucus.  You have a stiff neck.  You have changes in your: ? Vision. ? Hearing. ? Thinking. ? Mood. Summary  An upper respiratory infection (URI) is a common infection of the nose, throat, and upper air passages that lead to the lungs.  A URI is caused by a virus.  URIs usually get better on their own within 7-10 days.  Medicines cannot cure URIs, but your health care provider may recommend certain medicines to help relieve symptoms. This information is not intended to replace advice given to you by your health care provider. Make sure you discuss any questions you have with your health care provider. Document Revised: 07/12/2020  Document Reviewed: 07/12/2020 Elsevier Patient Education  2021 [employer].    If you have lab work done today you will be contacted with your lab results within the next 2 weeks.  If you have not heard from [place] then please contact us. The fastest way to get your results is to register for My Chart.   IF you received an x-ray today, you will receive an invoice from [facility]. Please contact [facility] at [phone] with questions or concerns regarding your invoice.   IF you received labwork today, you will receive an invoice from [place]. Please contact [facility] at [phone] with questions or concerns regarding your invoice.   Our billing staff will not be able to assist you with questions regarding bills from these companies.  You will be contacted with the lab results as soon as they are available. The fastest way to get your results is to activate your My Chart account. Instructions are located  on the last page of this paperwork. If you have not heard from [place] regarding the results in 2 weeks, please contact this office.

## 2023-06-12 ENCOUNTER — Other Ambulatory Visit: Payer: Self-pay | Admitting: Physician Assistant

## 2023-06-12 DIAGNOSIS — R748 Abnormal levels of other serum enzymes: Secondary | ICD-10-CM

## 2023-06-19 ENCOUNTER — Ambulatory Visit
Admission: RE | Admit: 2023-06-19 | Discharge: 2023-06-19 | Disposition: A | Discharge status: Home / Self Care | Payer: Managed Care, Other (non HMO) | Source: Ambulatory Visit | Attending: Physician Assistant | Admitting: Physician Assistant

## 2023-06-19 DIAGNOSIS — R748 Abnormal levels of other serum enzymes: Secondary | ICD-10-CM

## 2023-06-30 ENCOUNTER — Ambulatory Visit
Admission: EM | Admit: 2023-06-30 | Discharge: 2023-06-30 | Disposition: A | Discharge status: Home / Self Care | Payer: Managed Care, Other (non HMO)

## 2023-06-30 DIAGNOSIS — N611 Abscess of the breast and nipple: Secondary | ICD-10-CM | POA: Diagnosis not present

## 2023-06-30 DIAGNOSIS — L309 Dermatitis, unspecified: Secondary | ICD-10-CM | POA: Diagnosis not present

## 2023-06-30 MED ORDER — SULFAMETHOXAZOLE-TRIMETHOPRIM 800-160 MG PO TABS: 1.0000 | ORAL_TABLET | Freq: Two times a day (BID) | ORAL | 0 refills | Status: AC

## 2023-06-30 MED ORDER — TRIAMCINOLONE ACETONIDE 0.1 % EX CREA: 1.0000 | TOPICAL_CREAM | Freq: Two times a day (BID) | CUTANEOUS | 0 refills | Status: AC | PRN

## 2023-06-30 NOTE — Discharge Instructions (Signed)
Apply triamcinolone cream to rash on forehead.  Cover with a gauze while applying her CPAP machine to prevent reirritation of the rash.  You for a abscess on your right nipple.  Bactrim for the infection of the nipple and upper forehead.  Complete entire course of medication.  Take medication with food to avoid abdominal upset.
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
